# Patient Record
Sex: Female | Born: 1980 | Race: Black or African American | Hispanic: No | Marital: Married | Smoking: Current every day smoker
Health system: Southern US, Community
[De-identification: ages and names within clinical notes are randomized; demographics above are authoritative.]

## PROBLEM LIST (undated history)

## (undated) HISTORY — PX: FRACTURE SURGERY: SHX138

---
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
## 2002-07-15 ENCOUNTER — Other Ambulatory Visit: Admission: RE | Admit: 2002-07-15 | Discharge: 2002-07-15 | Payer: Self-pay | Admitting: Obstetrics and Gynecology

## 2002-08-05 ENCOUNTER — Inpatient Hospital Stay (HOSPITAL_COMMUNITY): Admission: AD | Admit: 2002-08-05 | Discharge: 2002-08-05 | Payer: Self-pay | Admitting: Obstetrics and Gynecology

## 2002-08-05 ENCOUNTER — Encounter: Payer: Self-pay | Admitting: Obstetrics and Gynecology

## 2004-03-18 ENCOUNTER — Ambulatory Visit: Payer: Self-pay | Admitting: Internal Medicine

## 2004-06-28 ENCOUNTER — Other Ambulatory Visit: Admission: RE | Admit: 2004-06-28 | Discharge: 2004-06-28 | Payer: Self-pay | Admitting: Obstetrics and Gynecology

## 2004-07-01 ENCOUNTER — Ambulatory Visit: Payer: Self-pay | Admitting: Internal Medicine

## 2005-04-28 ENCOUNTER — Emergency Department (HOSPITAL_COMMUNITY): Admission: EM | Admit: 2005-04-28 | Discharge: 2005-04-28 | Payer: Self-pay | Admitting: Emergency Medicine

## 2015-11-14 ENCOUNTER — Emergency Department (HOSPITAL_COMMUNITY): Payer: Managed Care, Other (non HMO)

## 2015-11-14 ENCOUNTER — Emergency Department (HOSPITAL_COMMUNITY)
Admission: EM | Admit: 2015-11-14 | Discharge: 2015-11-14 | Disposition: A | Discharge status: Home / Self Care | Payer: Managed Care, Other (non HMO) | Attending: Emergency Medicine | Admitting: Emergency Medicine

## 2015-11-14 ENCOUNTER — Encounter (HOSPITAL_COMMUNITY): Payer: Self-pay | Admitting: Oncology

## 2015-11-14 DIAGNOSIS — Y929 Unspecified place or not applicable: Secondary | ICD-10-CM | POA: Diagnosis not present

## 2015-11-14 DIAGNOSIS — Y999 Unspecified external cause status: Secondary | ICD-10-CM | POA: Insufficient documentation

## 2015-11-14 DIAGNOSIS — Z79899 Other long term (current) drug therapy: Secondary | ICD-10-CM | POA: Insufficient documentation

## 2015-11-14 DIAGNOSIS — Y939 Activity, unspecified: Secondary | ICD-10-CM | POA: Diagnosis not present

## 2015-11-14 DIAGNOSIS — S4992XA Unspecified injury of left shoulder and upper arm, initial encounter: Secondary | ICD-10-CM | POA: Diagnosis present

## 2015-11-14 DIAGNOSIS — F17210 Nicotine dependence, cigarettes, uncomplicated: Secondary | ICD-10-CM | POA: Insufficient documentation

## 2015-11-14 DIAGNOSIS — S42355A Nondisplaced comminuted fracture of shaft of humerus, left arm, initial encounter for closed fracture: Secondary | ICD-10-CM | POA: Insufficient documentation

## 2015-11-14 DIAGNOSIS — W06XXXA Fall from bed, initial encounter: Secondary | ICD-10-CM | POA: Insufficient documentation

## 2015-11-14 MED ORDER — OXYCODONE-ACETAMINOPHEN 5-325 MG PO TABS: 1.0000 | ORAL_TABLET | Freq: Four times a day (QID) | ORAL | 0 refills | Status: AC | PRN

## 2015-11-14 MED ORDER — HYDROCODONE-ACETAMINOPHEN 5-325 MG PO TABS
2.0000 | ORAL_TABLET | Freq: Once | ORAL | Status: AC
  Administered 2015-11-14: 2 via ORAL
  Filled 2015-11-14: qty 2

## 2015-11-14 MED ORDER — KETOROLAC TROMETHAMINE 60 MG/2ML IM SOLN
60.0000 mg | Freq: Once | INTRAMUSCULAR | Status: AC
  Administered 2015-11-14: 60 mg via INTRAMUSCULAR
  Filled 2015-11-14: qty 2

## 2015-11-14 MED ORDER — NAPROXEN 500 MG PO TABS: 500.0000 mg | ORAL_TABLET | Freq: Two times a day (BID) | ORAL | 0 refills | Status: AC

## 2015-11-14 NOTE — ED Notes (Signed)
This nurse offer to discharge pt. Per primary nurse pt waiting for CD of imaging.

## 2015-11-14 NOTE — ED Provider Notes (Signed)
[facility]-EMERGENCY DEPT Provider Note   CSN: [id number] Arrival date & time: 11/14/15  [id number]     History   Chief Complaint Chief Complaint  Patient presents with  . Elbow Pain    HPI Christina Johnson is a 35 y.o. female.  HPI   Christina Johnson is a 35 y.o. female, patient with no pertinent past medical history, presenting to the ED with left arm and elbow pain that began this morning. Patient rolled out of bed when she was startled by her phone, landed on her left elbow, and had immediate pain in the left arm. Has not taken anything for the pain. EtOH last night, but none this morning. Pain is severe, throbbing, nonradiating. Denies LOC, head injury, neck/back pain, neuro deficits, or any other complaints.     History reviewed. No pertinent past medical history.  There are no active problems to display for this patient.   Past Surgical History:  Procedure Laterality Date  . FRACTURE SURGERY Right    ankle    OB History    No data available       Home Medications    Prior to Admission medications   Medication Sig Start Date End Date Taking? Authorizing Provider  ibuprofen (ADVIL,MOTRIN) 200 MG tablet Take 600 mg by mouth every 6 (six) hours as needed for moderate pain.   Yes Historical Provider, MD  naproxen (NAPROSYN) 500 MG tablet Take 1 tablet (500 mg total) by mouth 2 (two) times daily. 11/14/15   Brealynn Contino C Jaiel Saraceno, PA-C  oxyCODONE-acetaminophen (PERCOCET/ROXICET) 5-325 MG tablet Take 1-2 tablets by mouth every 6 (six) hours as needed for severe pain. 11/14/15   Anselm PancoastShawn C Aliani Caccavale, PA-C    Family History No family history on file.  Social History Social History  Substance Use Topics  . Smoking status: Current Every Day Smoker    Packs/day: 0.50    Years: 15.00    Types: Cigarettes  . Smokeless tobacco: Never Used  . Alcohol use Yes     Comment: occasionaly     Allergies   Patient has no known allergies.   Review of Systems Review of Systems  Respiratory: Negative  for shortness of breath.   Cardiovascular: Negative for chest pain.  Gastrointestinal: Negative for nausea and vomiting.  Musculoskeletal: Positive for arthralgias and myalgias. Negative for back pain and neck pain.  Neurological: Negative for weakness and numbness.  All other systems reviewed and are negative.    Physical Exam Updated Vital Signs BP 121/77 (BP Location: Right Arm)   Pulse 78   Temp 98 F (36.7 C) (Oral)   Resp 14   Ht 5\' 4"  (1.626 m)   Wt 95.3 kg   LMP  (LMP Unknown)   SpO2 99%   BMI 36.05 kg/m   Physical Exam  Constitutional: She appears well-developed and well-nourished. No distress.  HENT:  Head: Normocephalic and atraumatic.  Eyes: Conjunctivae are normal.  Neck: Neck supple.  Cardiovascular: Normal rate, regular rhythm and intact distal pulses.   Pulmonary/Chest: Effort normal. No respiratory distress.  Abdominal: Soft. There is no tenderness. There is no guarding.  Musculoskeletal: She exhibits no edema.  Patient guards the left arm against her body. Tenderness, swelling and deformity to the left humerus. Motor function intact in the left wrist, hand, and shoulder.  Normal motor function intact in all other extremities and spine. No midline spinal tenderness.   Lymphadenopathy:    She has no cervical adenopathy.  Neurological: She is alert.  No sensory deficits in the bilateral upper extremities. Grip strength 5/5 bilaterally. Patient can flex and extend at the wrist without difficulty. Flexion and extension of the thumb and index finger intact against resistance.   Skin: Skin is warm and dry. Capillary refill takes less than 2 seconds. She is not diaphoretic.  Psychiatric: She has a normal mood and affect. Her behavior is normal.  Nursing note and vitals reviewed.    ED Treatments / Results  Labs (all labs ordered are listed, but only abnormal results are displayed) Labs Reviewed - No data to display  EKG  EKG Interpretation None        Radiology Dg Elbow Complete Left  Result Date: 11/14/2015 CLINICAL DATA:  Left elbow pain after fall out of bed. EXAM: LEFT ELBOW - COMPLETE 3+ VIEW COMPARISON:  None. FINDINGS: There is no evidence of fracture, dislocation, or joint effusion. There is no evidence of arthropathy or other focal bone abnormality. Soft tissues are unremarkable. IMPRESSION: Negative. Electronically Signed   By: Burman NievesWilliam  Stevens M.D.   On: 11/14/2015 06:56   Dg Humerus Left  Result Date: 11/14/2015 CLINICAL DATA:  [name] from bad 5 hours ago.  Pain and deformity. EXAM: LEFT HUMERUS - 2+ VIEW COMPARISON:  None. FINDINGS: There is a comminuted midshaft fracture of the humerus with mild lateral angulation. No markedly displaced fragments. IMPRESSION: Comminuted and mildly angulated mid humeral fracture. Electronically Signed   By: Paulina FusiMark  Shogry M.D.   On: 11/14/2015 07:49    Procedures .Splint Application Date/Time: 11/14/2015 9:20 AM Performed by: Anselm PancoastJOY, Kavaughn Faucett C Authorized by: Anselm PancoastJOY, Meryem Haertel C   Consent:    Consent obtained:  Verbal   Consent given by:  Patient   Risks discussed:  Discoloration, numbness, pain and swelling Pre-procedure details:    Sensation:  Normal   Skin color:  Normal Procedure details:    Laterality:  Left   Location:  Arm   Arm:  L upper arm   Splint type:  Long arm   Supplies:  Ortho-Glass Post-procedure details:    Pain:  Unchanged   Sensation:  Normal   Skin color:  Normal   Patient tolerance of procedure:  Tolerated well, no immediate complications Comments:     Procedure was performed by the Med Tech with my evaluation before and after. I was available for consultation throughout the procedure.   (including critical care time)  Medications Ordered in ED Medications  ketorolac (TORADOL) injection 60 mg (60 mg Intramuscular Given 11/14/15 0738)  HYDROcodone-acetaminophen (NORCO/VICODIN) 5-325 MG per tablet 2 tablet (2 tablets Oral Given 11/14/15 [id number])     Initial  Impression / Assessment and Plan / ED Course  I have reviewed the triage vital signs and the nursing notes.  Pertinent labs & imaging results that were available during my care of the patient were reviewed by me and considered in my medical decision making (see chart for details).  Clinical Course     Patient presents with left arm injury that occurred just prior to arrival. Comminuted humeral shaft fracture on x-ray. Neurovascularly intact. Pt notes that she lives in [place] and is here in [place] visiting, however, I thought it prudent to consult a local surgeon while she is here in the ED. Patient was advised she is free to follow up with our surgeon or one of her own choosing closer to home. Patient was sent home with a disc of her xrays. 8:51 AM Spoke with Dr. Ophelia CharterYates, orthopedic surgeon, who recommends a long-arm  splint, sling, and outpatient follow-up.  Findings and plan of care discussed with Rolland Porter, MD.   Vitals:   11/14/15 0615 11/14/15 0622 11/14/15 0624  BP:  121/77   Pulse:  78   Resp:  14   Temp:  98 F (36.7 C)   TempSrc:  Oral   SpO2: 97% 99%   Weight:   95.3 kg  Height:   5\' 4"  (1.626 m)     Final Clinical Impressions(s) / ED Diagnoses   Final diagnoses:  Closed nondisplaced comminuted fracture of shaft of left humerus, initial encounter    New Prescriptions New Prescriptions   NAPROXEN (NAPROSYN) 500 MG TABLET    Take 1 tablet (500 mg total) by mouth 2 (two) times daily.   OXYCODONE-ACETAMINOPHEN (PERCOCET/ROXICET) 5-325 MG TABLET    Take 1-2 tablets by mouth every 6 (six) hours as needed for severe pain.     Anselm Pancoast, PA-C 11/14/15 1011    Rolland Porter, MD 12/01/15 1040

## 2015-11-14 NOTE — ED Triage Notes (Signed)
Pt bib [facility] d/t left elbow pain.  Pt reports to EMS that there was a death in the family and everyone was consuming ETOH.  Pt fell out of bed and struck her left elbow.

## 2015-11-14 NOTE — Discharge Instructions (Signed)
There was a fracture noted to the left upper arm. A splint was placed here in the ED. You will need to follow up with the orthopedic surgeon. Call the number provided to set up an appointment. Naproxen or ibuprofen to reduce pain and swelling. Percocet for severe pain. Do not drive or perform other dangerous activities while taking the Percocet.

## 2017-08-17 IMAGING — CR DG HUMERUS 2V *L*
2 series · 2 of 2 positions shown · non-contrast
Comparison: None.

CLINICAL DATA: Fell from bad 5 hours ago.  Pain and deformity.

EXAM:
LEFT HUMERUS - 2+ VIEW

[x humerus ap left (1 of 2)]
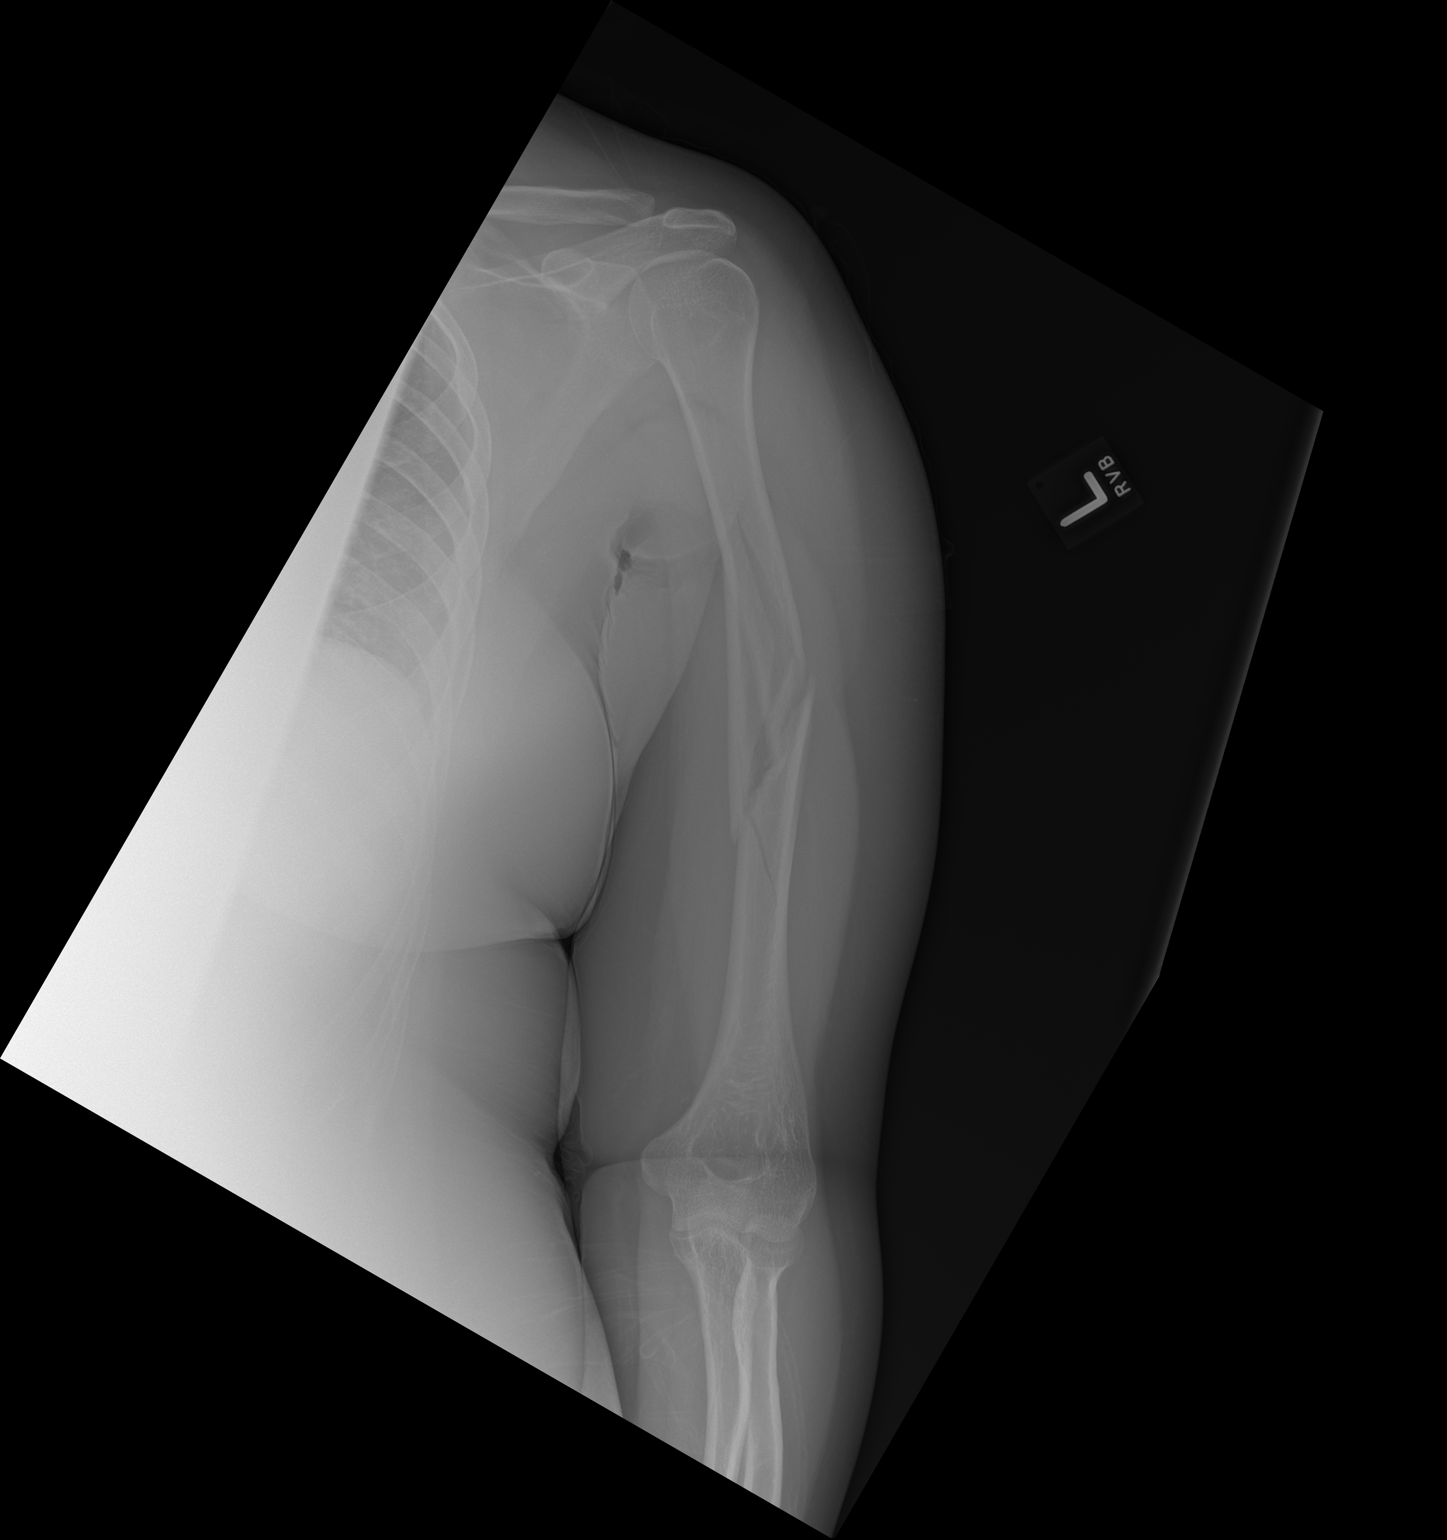

[x humerus ap left (2 of 2)]
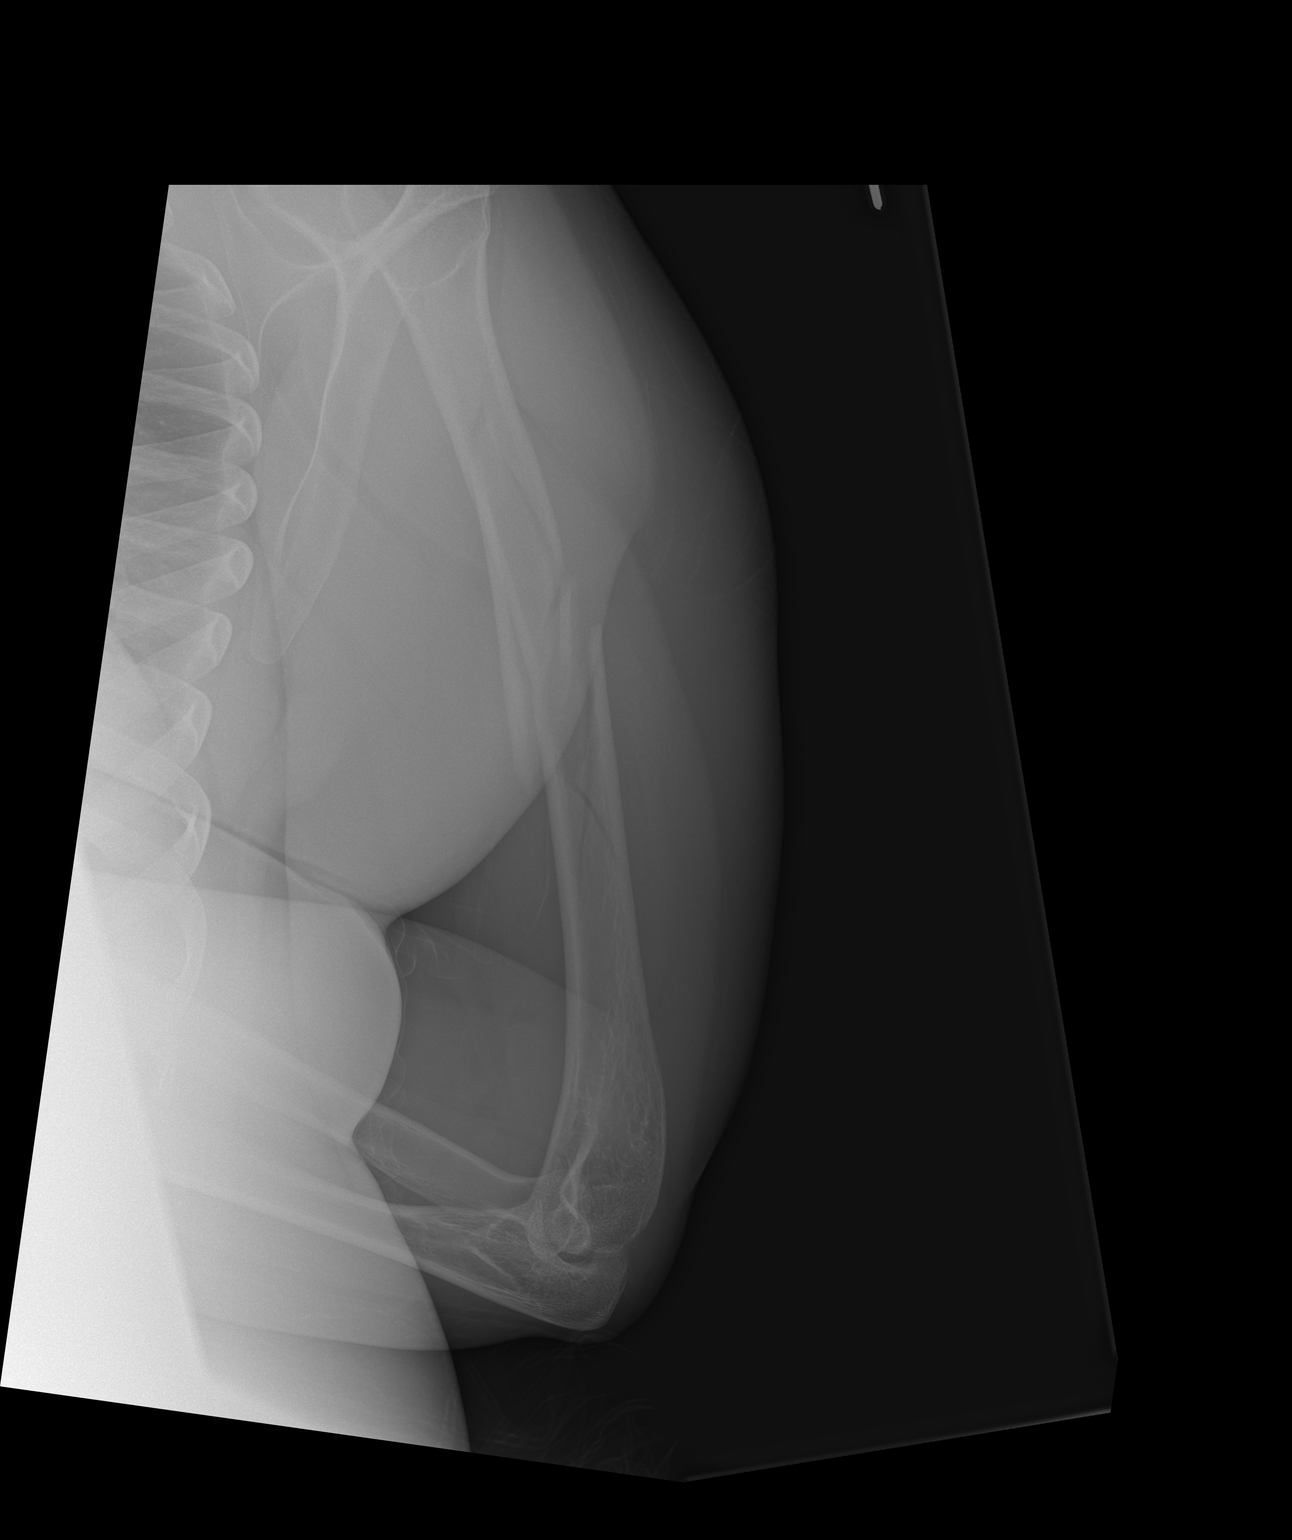

[2 of 2 positions shown; findings below may reference images not displayed]

FINDINGS: There is a comminuted midshaft fracture of the humerus with mild
lateral angulation. No markedly displaced fragments.
IMPRESSION: Comminuted and mildly angulated mid humeral fracture.

## 2017-08-17 IMAGING — CR DG ELBOW COMPLETE 3+V*L*
3 series · 3 of 3 positions shown · non-contrast
Comparison: None.

CLINICAL DATA: Left elbow pain after fall out of bed.

EXAM:
LEFT ELBOW - COMPLETE 3+ VIEW

[x elbow ap left]
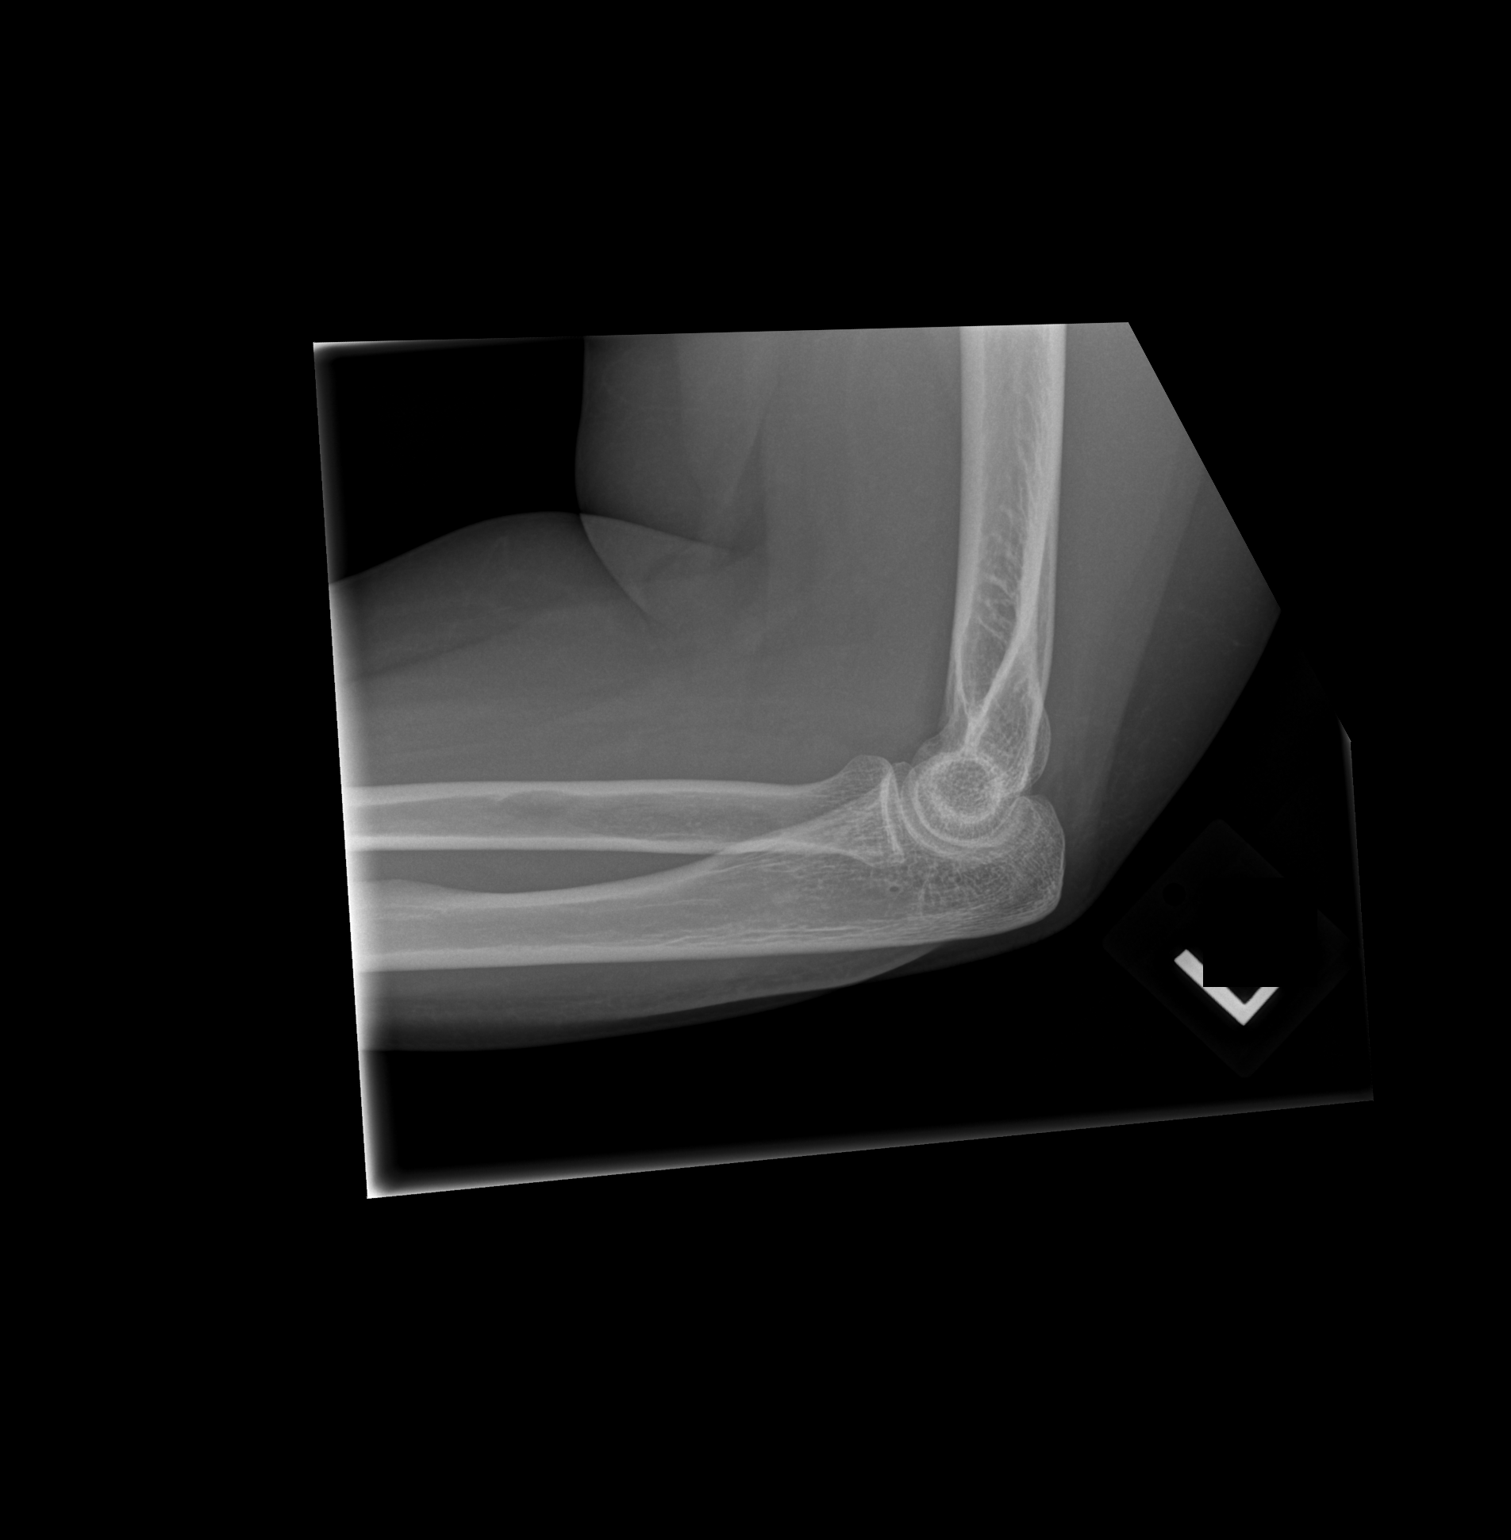

[x elbow lat left (1 of 2)]
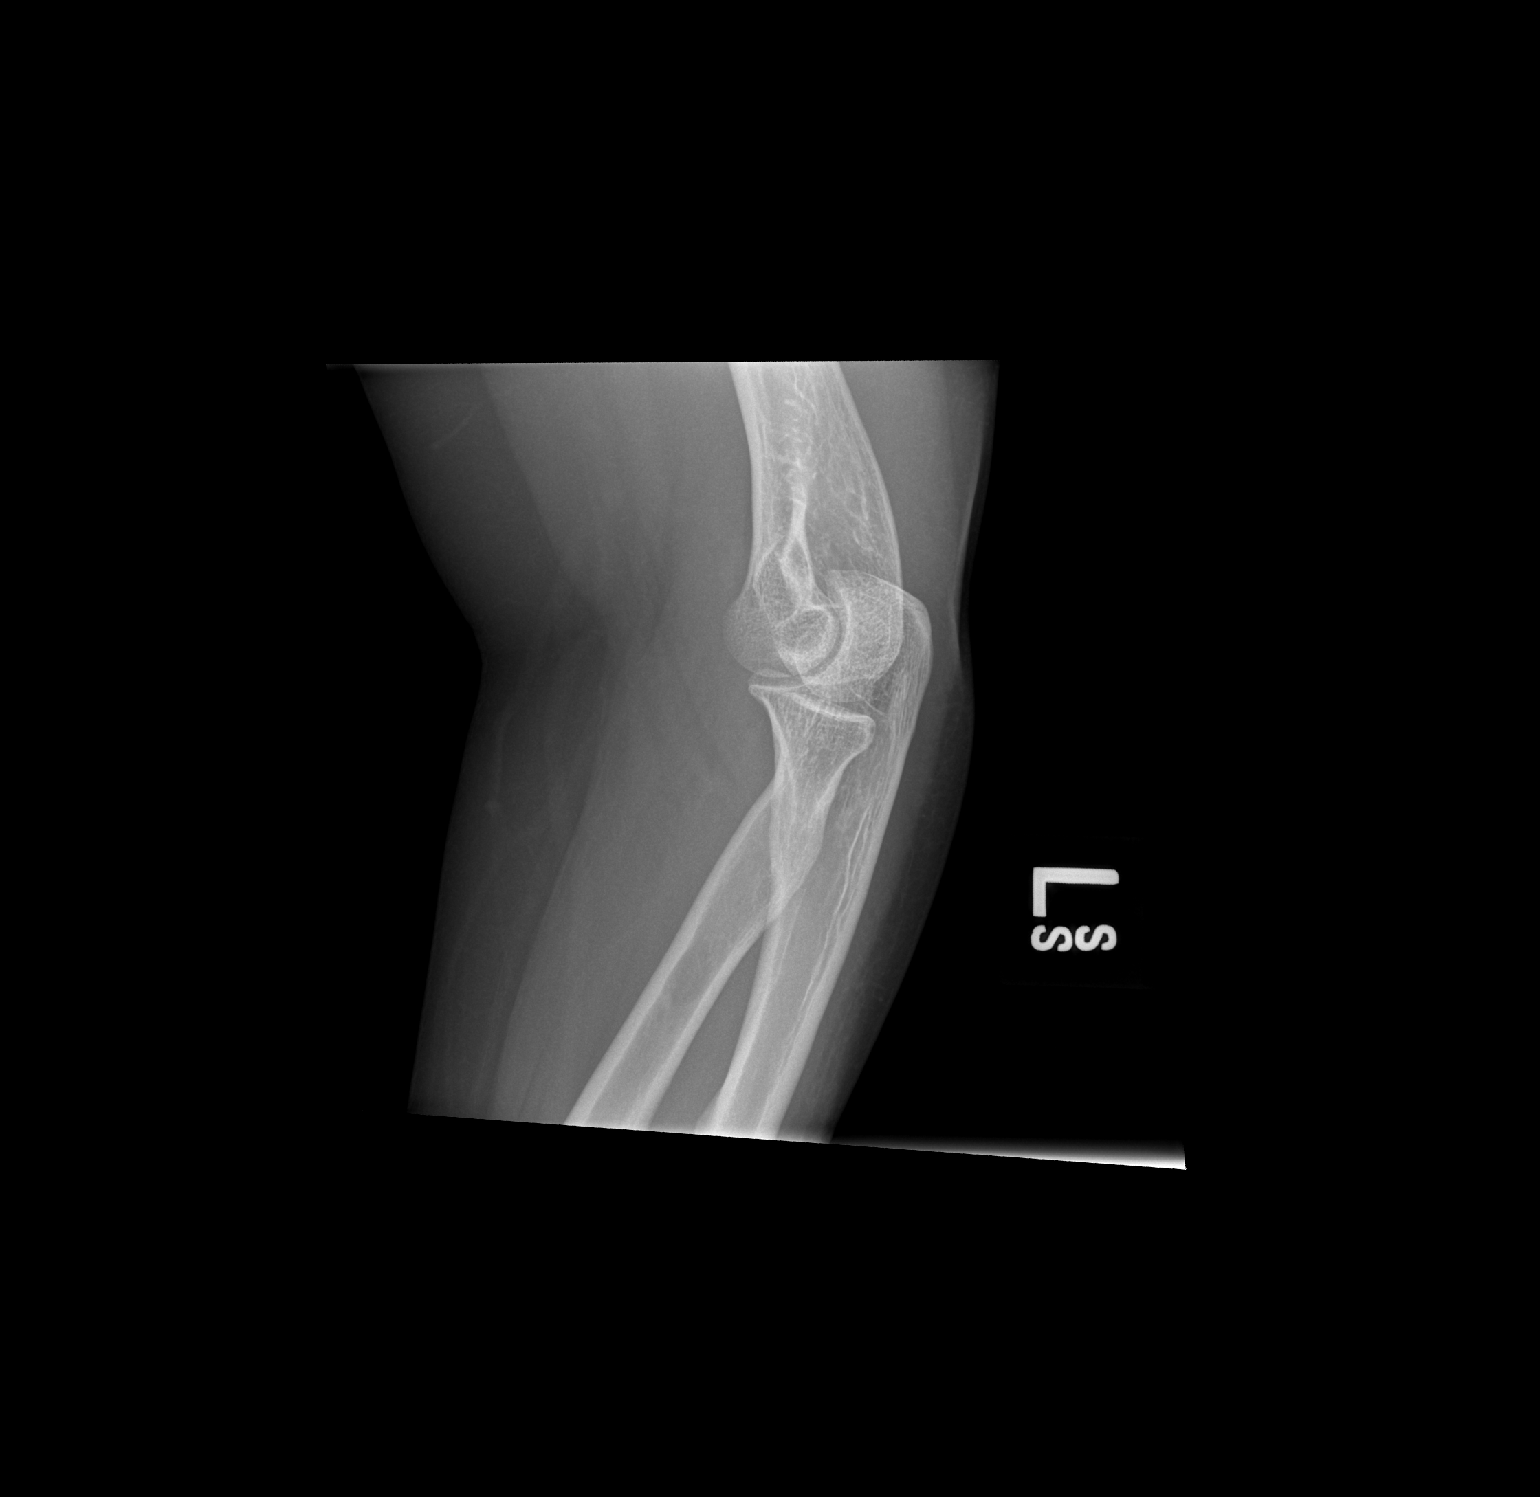

[x elbow lat left (2 of 2)]
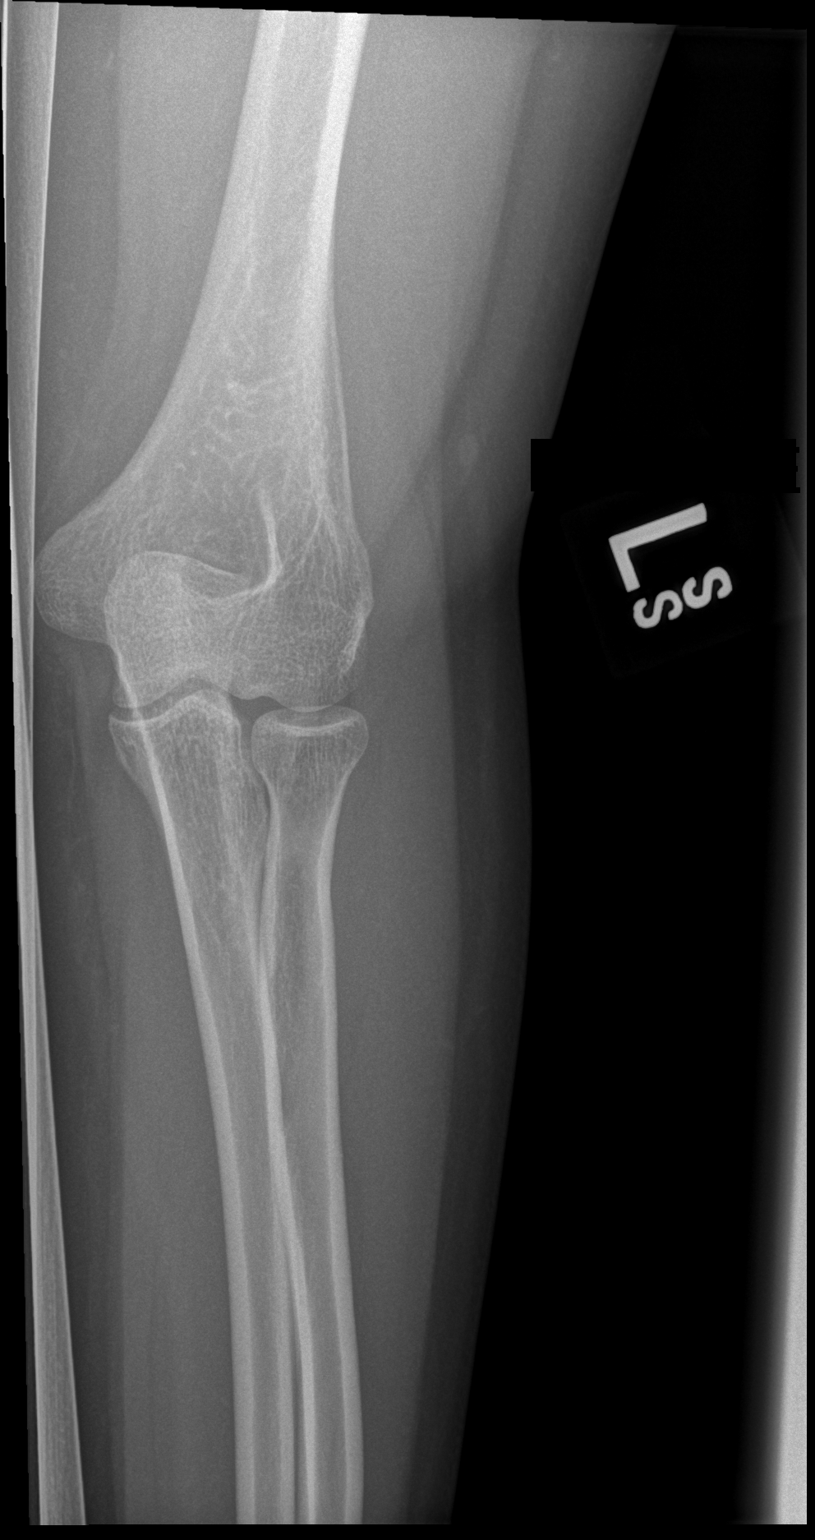

[3 of 3 positions shown; findings below may reference images not displayed]

FINDINGS: There is no evidence of fracture, dislocation, or joint effusion.
There is no evidence of arthropathy or other focal bone abnormality.
Soft tissues are unremarkable.
IMPRESSION: Negative.
